# Patient Record
Sex: Male | Born: 1995 | Race: White | Hispanic: No | Marital: Single | Smoking: Never smoker
Health system: Southern US, Community
[De-identification: ages and names within clinical notes are randomized; demographics above are authoritative.]

---
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
## 2016-08-01 DIAGNOSIS — H6692 Otitis media, unspecified, left ear: Secondary | ICD-10-CM | POA: Diagnosis not present

## 2016-08-01 DIAGNOSIS — H6093 Unspecified otitis externa, bilateral: Secondary | ICD-10-CM | POA: Diagnosis not present

## 2016-08-16 DIAGNOSIS — H6592 Unspecified nonsuppurative otitis media, left ear: Secondary | ICD-10-CM | POA: Diagnosis not present

## 2016-12-08 DIAGNOSIS — Z23 Encounter for immunization: Secondary | ICD-10-CM | POA: Diagnosis not present

## 2017-10-19 DIAGNOSIS — S93411A Sprain of calcaneofibular ligament of right ankle, initial encounter: Secondary | ICD-10-CM | POA: Diagnosis not present

## 2017-10-24 DIAGNOSIS — S93411D Sprain of calcaneofibular ligament of right ankle, subsequent encounter: Secondary | ICD-10-CM | POA: Diagnosis not present

## 2017-12-11 DIAGNOSIS — J029 Acute pharyngitis, unspecified: Secondary | ICD-10-CM | POA: Diagnosis not present

## 2018-09-20 DIAGNOSIS — Z1159 Encounter for screening for other viral diseases: Secondary | ICD-10-CM | POA: Diagnosis not present

## 2019-01-29 DIAGNOSIS — Z20828 Contact with and (suspected) exposure to other viral communicable diseases: Secondary | ICD-10-CM | POA: Diagnosis not present

## 2019-06-08 ENCOUNTER — Ambulatory Visit: Payer: Self-pay | Attending: Internal Medicine

## 2019-06-08 DIAGNOSIS — Z23 Encounter for immunization: Secondary | ICD-10-CM

## 2019-06-08 NOTE — Progress Notes (Signed)
   Covid-19 Vaccination Clinic  Name:  Maclain Cohron    MRN: [id number] DOB: 1995-02-04  06/08/2019  Mr. Schollmeyer was observed post Covid-19 immunization for 15 minutes without incident. He was provided with Vaccine Information Sheet and instruction to access the V-Safe system.   Mr. Voit was instructed to call 911 with any severe reactions post vaccine: Marland Kitchen Difficulty breathing  . Swelling of face and throat  . A fast heartbeat  . A bad rash all over body  . Dizziness and weakness   Immunizations Administered    Name Date Dose VIS Date Route   Pfizer COVID-19 Vaccine 06/08/2019 10:22 AM 0.3 mL 03/27/2018 Intramuscular   Manufacturer: [employer], [employer]   Lot: [id number]   NDC: [address]-1

## 2019-07-02 ENCOUNTER — Ambulatory Visit: Payer: Self-pay | Attending: Internal Medicine

## 2019-07-02 DIAGNOSIS — Z23 Encounter for immunization: Secondary | ICD-10-CM

## 2019-07-02 NOTE — Progress Notes (Signed)
   Covid-19 Vaccination Clinic  Name:  Norman Stanley    MRN: [id number] DOB: [birth year]  07/02/2019  [name] was observed post Covid-19 immunization for 30 minutes based on pre-vaccination screening without incident. He was provided with Vaccine Information Sheet and instruction to access the V-Safe system.   [name] was instructed to call 911 with any severe reactions post vaccine: Marland Kitchen Difficulty breathing  . Swelling of face and throat  . A fast heartbeat  . A bad rash all over body  . Dizziness and weakness   Immunizations Administered    Name Date Dose VIS Date Route   Pfizer COVID-19 Vaccine 07/02/2019 12:37 PM 0.3 mL 03/27/2018 Intramuscular   Manufacturer: [employer], [employer]   Lot: [id number]   NDC: [address]-5
